# Patient Record
Sex: Male | Born: 1945 | Smoking: Current every day smoker
Health system: Southern US, Community
[De-identification: ages and names within clinical notes are randomized; demographics above are authoritative.]

## PROBLEM LIST (undated history)

## (undated) DIAGNOSIS — M1990 Unspecified osteoarthritis, unspecified site: Secondary | ICD-10-CM

---

## 2013-04-07 ENCOUNTER — Encounter (HOSPITAL_COMMUNITY): Payer: Self-pay | Admitting: Emergency Medicine

## 2013-04-07 ENCOUNTER — Emergency Department (HOSPITAL_COMMUNITY): Payer: PRIVATE HEALTH INSURANCE

## 2013-04-07 ENCOUNTER — Emergency Department (HOSPITAL_COMMUNITY)
Admission: EM | Admit: 2013-04-07 | Discharge: 2013-04-07 | Disposition: A | Discharge status: Home / Self Care | Payer: PRIVATE HEALTH INSURANCE | Attending: Emergency Medicine | Admitting: Emergency Medicine

## 2013-04-07 DIAGNOSIS — F17200 Nicotine dependence, unspecified, uncomplicated: Secondary | ICD-10-CM | POA: Insufficient documentation

## 2013-04-07 DIAGNOSIS — S060X9A Concussion with loss of consciousness of unspecified duration, initial encounter: Secondary | ICD-10-CM | POA: Diagnosis present

## 2013-04-07 DIAGNOSIS — S06379A Contusion, laceration, and hemorrhage of cerebellum with loss of consciousness of unspecified duration, initial encounter: Secondary | ICD-10-CM

## 2013-04-07 DIAGNOSIS — Y92410 Unspecified street and highway as the place of occurrence of the external cause: Secondary | ICD-10-CM | POA: Insufficient documentation

## 2013-04-07 DIAGNOSIS — M545 Low back pain, unspecified: Secondary | ICD-10-CM | POA: Insufficient documentation

## 2013-04-07 DIAGNOSIS — Z8739 Personal history of other diseases of the musculoskeletal system and connective tissue: Secondary | ICD-10-CM | POA: Insufficient documentation

## 2013-04-07 DIAGNOSIS — Y9389 Activity, other specified: Secondary | ICD-10-CM | POA: Insufficient documentation

## 2013-04-07 DIAGNOSIS — G8929 Other chronic pain: Secondary | ICD-10-CM | POA: Insufficient documentation

## 2013-04-07 HISTORY — DX: Unspecified osteoarthritis, unspecified site: M19.90

## 2013-04-07 LAB — BASIC METABOLIC PANEL
BUN: 14 mg/dL (ref 6–23)
CO2: 23 mEq/L (ref 19–32)
Creatinine, Ser: 1.08 mg/dL (ref 0.50–1.35)
GFR calc non Af Amer: 69 mL/min — ABNORMAL LOW (ref >90)
Glucose, Bld: 120 mg/dL — ABNORMAL HIGH (ref 70–99)
Potassium: 4.6 mEq/L (ref 3.5–5.1)

## 2013-04-07 LAB — CBC WITH DIFFERENTIAL/PLATELET
Eosinophils Absolute: 0.1 10*3/uL (ref 0.0–0.7)
Eosinophils Relative: 1 % (ref 0–5)
HCT: 45.2 % (ref 39.0–52.0)
Hemoglobin: 16.4 g/dL (ref 13.0–17.0)
Lymphocytes Relative: 18 % (ref 12–46)
Lymphs Abs: 1 10*3/uL (ref 0.7–4.0)
MCH: 33.1 pg (ref 26.0–34.0)
MCHC: 36.3 g/dL — ABNORMAL HIGH (ref 30.0–36.0)
MCV: 91.1 fL (ref 78.0–100.0)
Monocytes Absolute: 0.7 10*3/uL (ref 0.1–1.0)
Monocytes Relative: 13 % — ABNORMAL HIGH (ref 3–12)
RBC: 4.96 MIL/uL (ref 4.22–5.81)

## 2013-04-07 NOTE — ED Notes (Signed)
MD at bedside. 

## 2013-04-07 NOTE — ED Notes (Signed)
Back board removed from pt. C-collar still present. PA present.

## 2013-04-07 NOTE — ED Notes (Signed)
EDP aware that pt. Is up and walking around.
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
## 2020-03-15 DEATH — deceased
